# Patient Record
Sex: Male | Born: 1970 | Race: Black or African American | Hispanic: No | Marital: Single | Smoking: Current every day smoker
Health system: Southern US, Community
[De-identification: ages and names within clinical notes are randomized; demographics above are authoritative.]

## PROBLEM LIST (undated history)

## (undated) DIAGNOSIS — I10 Essential (primary) hypertension: Secondary | ICD-10-CM

## (undated) DIAGNOSIS — N182 Chronic kidney disease, stage 2 (mild): Secondary | ICD-10-CM

## (undated) DIAGNOSIS — K5732 Diverticulitis of large intestine without perforation or abscess without bleeding: Secondary | ICD-10-CM

## (undated) HISTORY — PX: ABDOMINAL SURGERY: SHX537

## (undated) HISTORY — PX: CHEST SURGERY: SHX595

---

## 2015-01-30 ENCOUNTER — Inpatient Hospital Stay (HOSPITAL_COMMUNITY)
Admission: EM | Admit: 2015-01-30 | Discharge: 2015-02-02 | DRG: 392 | Disposition: A | Discharge status: Home / Self Care | Payer: Self-pay | Attending: Internal Medicine | Admitting: Internal Medicine

## 2015-01-30 ENCOUNTER — Emergency Department (HOSPITAL_COMMUNITY): Payer: Self-pay

## 2015-01-30 ENCOUNTER — Encounter (HOSPITAL_COMMUNITY): Payer: Self-pay

## 2015-01-30 DIAGNOSIS — K5792 Diverticulitis of intestine, part unspecified, without perforation or abscess without bleeding: Secondary | ICD-10-CM | POA: Diagnosis present

## 2015-01-30 DIAGNOSIS — I129 Hypertensive chronic kidney disease with stage 1 through stage 4 chronic kidney disease, or unspecified chronic kidney disease: Secondary | ICD-10-CM | POA: Diagnosis present

## 2015-01-30 DIAGNOSIS — Z9119 Patient's noncompliance with other medical treatment and regimen: Secondary | ICD-10-CM | POA: Diagnosis present

## 2015-01-30 DIAGNOSIS — K5732 Diverticulitis of large intestine without perforation or abscess without bleeding: Principal | ICD-10-CM

## 2015-01-30 DIAGNOSIS — F17210 Nicotine dependence, cigarettes, uncomplicated: Secondary | ICD-10-CM | POA: Diagnosis present

## 2015-01-30 DIAGNOSIS — F17200 Nicotine dependence, unspecified, uncomplicated: Secondary | ICD-10-CM | POA: Diagnosis present

## 2015-01-30 DIAGNOSIS — E876 Hypokalemia: Secondary | ICD-10-CM | POA: Diagnosis present

## 2015-01-30 DIAGNOSIS — N183 Chronic kidney disease, stage 3 (moderate): Secondary | ICD-10-CM | POA: Diagnosis present

## 2015-01-30 DIAGNOSIS — I10 Essential (primary) hypertension: Secondary | ICD-10-CM | POA: Diagnosis present

## 2015-01-30 DIAGNOSIS — N182 Chronic kidney disease, stage 2 (mild): Secondary | ICD-10-CM

## 2015-01-30 DIAGNOSIS — N179 Acute kidney failure, unspecified: Secondary | ICD-10-CM | POA: Diagnosis present

## 2015-01-30 HISTORY — DX: Diverticulitis of large intestine without perforation or abscess without bleeding: K57.32

## 2015-01-30 HISTORY — DX: Essential (primary) hypertension: I10

## 2015-01-30 HISTORY — DX: Chronic kidney disease, stage 2 (mild): N18.2

## 2015-01-30 LAB — URINALYSIS, ROUTINE W REFLEX MICROSCOPIC
Bilirubin Urine: NEGATIVE
Glucose, UA: NEGATIVE mg/dL
Leukocytes, UA: NEGATIVE
NITRITE: NEGATIVE
Protein, ur: 30 mg/dL — AB
Specific Gravity, Urine: >1.03 — ABNORMAL HIGH (ref 1.005–1.030)
Urobilinogen, UA: 0.2 mg/dL (ref 0.0–1.0)
pH: 5.5 (ref 5.0–8.0)

## 2015-01-30 LAB — CBC WITH DIFFERENTIAL/PLATELET
BASOS PCT: 0 % (ref 0–1)
Basophils Absolute: 0 10*3/uL (ref 0.0–0.1)
Eosinophils Absolute: 0 10*3/uL (ref 0.0–0.7)
Eosinophils Relative: 0 % (ref 0–5)
HEMATOCRIT: 47.8 % (ref 39.0–52.0)
HEMOGLOBIN: 16.1 g/dL (ref 13.0–17.0)
Lymphocytes Relative: 15 % (ref 12–46)
Lymphs Abs: 2.3 10*3/uL (ref 0.7–4.0)
MCH: 29.3 pg (ref 26.0–34.0)
MCHC: 33.7 g/dL (ref 30.0–36.0)
MCV: 87.1 fL (ref 78.0–100.0)
MONO ABS: 1.7 10*3/uL — AB (ref 0.1–1.0)
Monocytes Relative: 12 % (ref 3–12)
NEUTROS PCT: 73 % (ref 43–77)
Neutro Abs: 11 10*3/uL — ABNORMAL HIGH (ref 1.7–7.7)
Platelets: 246 10*3/uL (ref 150–400)
RBC: 5.49 MIL/uL (ref 4.22–5.81)
RDW: 13.9 % (ref 11.5–15.5)
WBC: 15 10*3/uL — ABNORMAL HIGH (ref 4.0–10.5)

## 2015-01-30 LAB — COMPREHENSIVE METABOLIC PANEL
ALBUMIN: 4.4 g/dL (ref 3.5–5.0)
ALK PHOS: 78 U/L (ref 38–126)
ALT: 20 U/L (ref 17–63)
ANION GAP: 12 (ref 5–15)
AST: 20 U/L (ref 15–41)
BUN: 13 mg/dL (ref 6–20)
CO2: 24 mmol/L (ref 22–32)
Calcium: 9.3 mg/dL (ref 8.9–10.3)
Chloride: 102 mmol/L (ref 101–111)
Creatinine, Ser: 1.45 mg/dL — ABNORMAL HIGH (ref 0.61–1.24)
GFR calc Af Amer: >60 mL/min (ref >60)
GFR calc non Af Amer: 58 mL/min — ABNORMAL LOW (ref >60)
GLUCOSE: 102 mg/dL — AB (ref 65–99)
POTASSIUM: 3.3 mmol/L — AB (ref 3.5–5.1)
Sodium: 138 mmol/L (ref 135–145)
Total Bilirubin: 0.9 mg/dL (ref 0.3–1.2)
Total Protein: 8.3 g/dL — ABNORMAL HIGH (ref 6.5–8.1)

## 2015-01-30 LAB — URINE MICROSCOPIC-ADD ON

## 2015-01-30 LAB — LIPASE, BLOOD: Lipase: 20 U/L — ABNORMAL LOW (ref 22–51)

## 2015-01-30 MED ORDER — METRONIDAZOLE IN NACL 5-0.79 MG/ML-% IV SOLN
500.0000 mg | Freq: Once | INTRAVENOUS | Status: AC
  Administered 2015-01-31: 500 mg via INTRAVENOUS
  Filled 2015-01-30: qty 100

## 2015-01-30 MED ORDER — IOHEXOL 300 MG/ML  SOLN
50.0000 mL | Freq: Once | INTRAMUSCULAR | Status: AC | PRN
  Administered 2015-01-30: 50 mL via ORAL

## 2015-01-30 MED ORDER — SODIUM CHLORIDE 0.9 % IV BOLUS (SEPSIS)
1000.0000 mL | Freq: Once | INTRAVENOUS | Status: AC
  Administered 2015-01-30: 1000 mL via INTRAVENOUS

## 2015-01-30 MED ORDER — MORPHINE SULFATE 4 MG/ML IJ SOLN
4.0000 mg | Freq: Once | INTRAMUSCULAR | Status: AC
  Administered 2015-01-30: 4 mg via INTRAVENOUS
  Filled 2015-01-30: qty 1

## 2015-01-30 MED ORDER — HYDROMORPHONE HCL 1 MG/ML IJ SOLN
1.0000 mg | Freq: Once | INTRAMUSCULAR | Status: DC
  Filled 2015-01-30: qty 1

## 2015-01-30 MED ORDER — CIPROFLOXACIN IN D5W 400 MG/200ML IV SOLN
400.0000 mg | Freq: Once | INTRAVENOUS | Status: AC
  Administered 2015-01-30: 400 mg via INTRAVENOUS
  Filled 2015-01-30: qty 200

## 2015-01-30 MED ORDER — IOHEXOL 300 MG/ML  SOLN
100.0000 mL | Freq: Once | INTRAMUSCULAR | Status: AC | PRN
  Administered 2015-01-30: 100 mL via INTRAVENOUS

## 2015-01-30 MED ORDER — HYDROMORPHONE HCL 1 MG/ML IJ SOLN
1.0000 mg | Freq: Once | INTRAMUSCULAR | Status: AC
  Administered 2015-01-30: 1 mg via INTRAVENOUS

## 2015-01-30 NOTE — ED Notes (Signed)
Patient is resting comfortably. 
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
## 2015-01-30 NOTE — ED Notes (Signed)
I have been having abdominal pain for the past 3 days per pt. I have been having nausea, vomiting, and diarrhea (after taking Epson salt).  Initially thought he was constipated. The pain has been really severe. Hurting more on the left side per pt.

## 2015-01-31 ENCOUNTER — Encounter (HOSPITAL_COMMUNITY): Payer: Self-pay | Admitting: Internal Medicine

## 2015-01-31 DIAGNOSIS — F17200 Nicotine dependence, unspecified, uncomplicated: Secondary | ICD-10-CM | POA: Diagnosis present

## 2015-01-31 DIAGNOSIS — I10 Essential (primary) hypertension: Secondary | ICD-10-CM | POA: Diagnosis present

## 2015-01-31 DIAGNOSIS — N179 Acute kidney failure, unspecified: Secondary | ICD-10-CM | POA: Insufficient documentation

## 2015-01-31 DIAGNOSIS — K5792 Diverticulitis of intestine, part unspecified, without perforation or abscess without bleeding: Secondary | ICD-10-CM | POA: Diagnosis present

## 2015-01-31 DIAGNOSIS — E876 Hypokalemia: Secondary | ICD-10-CM | POA: Diagnosis present

## 2015-01-31 LAB — CBC
HCT: 42.7 % (ref 39.0–52.0)
Hemoglobin: 13.8 g/dL (ref 13.0–17.0)
MCH: 28.5 pg (ref 26.0–34.0)
MCHC: 32.3 g/dL (ref 30.0–36.0)
MCV: 88.2 fL (ref 78.0–100.0)
Platelets: 231 10*3/uL (ref 150–400)
RBC: 4.84 MIL/uL (ref 4.22–5.81)
RDW: 13.9 % (ref 11.5–15.5)
WBC: 13.7 10*3/uL — ABNORMAL HIGH (ref 4.0–10.5)

## 2015-01-31 LAB — BASIC METABOLIC PANEL
Anion gap: 11 (ref 5–15)
BUN: 9 mg/dL (ref 6–20)
CHLORIDE: 102 mmol/L (ref 101–111)
CO2: 22 mmol/L (ref 22–32)
Calcium: 8.4 mg/dL — ABNORMAL LOW (ref 8.9–10.3)
Creatinine, Ser: 1.43 mg/dL — ABNORMAL HIGH (ref 0.61–1.24)
GFR calc Af Amer: >60 mL/min (ref >60)
GFR calc non Af Amer: 59 mL/min — ABNORMAL LOW (ref >60)
Glucose, Bld: 118 mg/dL — ABNORMAL HIGH (ref 65–99)
Potassium: 3.1 mmol/L — ABNORMAL LOW (ref 3.5–5.1)
Sodium: 135 mmol/L (ref 135–145)

## 2015-01-31 LAB — MAGNESIUM: Magnesium: 2.2 mg/dL (ref 1.7–2.4)

## 2015-01-31 MED ORDER — POTASSIUM CHLORIDE CRYS ER 20 MEQ PO TBCR
20.0000 meq | EXTENDED_RELEASE_TABLET | Freq: Three times a day (TID) | ORAL | Status: DC
  Administered 2015-01-31: 20 meq via ORAL
  Filled 2015-01-31: qty 1

## 2015-01-31 MED ORDER — CLONIDINE HCL 0.2 MG/24HR TD PTWK
0.2000 mg | MEDICATED_PATCH | TRANSDERMAL | Status: DC
  Administered 2015-01-31: 0.2 mg via TRANSDERMAL
  Filled 2015-01-31: qty 1

## 2015-01-31 MED ORDER — HEPARIN SODIUM (PORCINE) 5000 UNIT/ML IJ SOLN
5000.0000 [IU] | Freq: Three times a day (TID) | INTRAMUSCULAR | Status: DC
  Administered 2015-01-31 – 2015-02-01 (×3): 5000 [IU] via SUBCUTANEOUS
  Filled 2015-01-31 (×3): qty 1

## 2015-01-31 MED ORDER — HYDRALAZINE HCL 25 MG PO TABS
25.0000 mg | ORAL_TABLET | Freq: Three times a day (TID) | ORAL | Status: DC
  Administered 2015-01-31: 25 mg via ORAL
  Filled 2015-01-31: qty 1

## 2015-01-31 MED ORDER — POTASSIUM CHLORIDE IN NACL 40-0.9 MEQ/L-% IV SOLN
INTRAVENOUS | Status: DC
  Administered 2015-01-31 – 2015-02-01 (×2): 75 mL/h via INTRAVENOUS

## 2015-01-31 MED ORDER — ONDANSETRON HCL 4 MG/2ML IJ SOLN
4.0000 mg | Freq: Four times a day (QID) | INTRAMUSCULAR | Status: DC | PRN
  Administered 2015-01-31 (×2): 4 mg via INTRAVENOUS
  Filled 2015-01-31 (×2): qty 2

## 2015-01-31 MED ORDER — NICOTINE 14 MG/24HR TD PT24
14.0000 mg | MEDICATED_PATCH | Freq: Every day | TRANSDERMAL | Status: DC
  Administered 2015-01-31 – 2015-02-02 (×3): 14 mg via TRANSDERMAL
  Filled 2015-01-31 (×3): qty 1

## 2015-01-31 MED ORDER — METRONIDAZOLE IN NACL 5-0.79 MG/ML-% IV SOLN
500.0000 mg | Freq: Three times a day (TID) | INTRAVENOUS | Status: DC
  Administered 2015-01-31 – 2015-02-02 (×6): 500 mg via INTRAVENOUS
  Filled 2015-01-31 (×6): qty 100

## 2015-01-31 MED ORDER — CLONIDINE HCL 0.1 MG PO TABS
0.1000 mg | ORAL_TABLET | Freq: Three times a day (TID) | ORAL | Status: DC | PRN
  Administered 2015-01-31: 0.1 mg via ORAL
  Filled 2015-01-31: qty 1

## 2015-01-31 MED ORDER — CIPROFLOXACIN IN D5W 400 MG/200ML IV SOLN
400.0000 mg | Freq: Two times a day (BID) | INTRAVENOUS | Status: DC
  Administered 2015-01-31 – 2015-02-01 (×4): 400 mg via INTRAVENOUS
  Filled 2015-01-31 (×4): qty 200

## 2015-01-31 MED ORDER — POTASSIUM CHLORIDE 10 MEQ/100ML IV SOLN
10.0000 meq | INTRAVENOUS | Status: AC
  Administered 2015-01-31 (×4): 10 meq via INTRAVENOUS
  Filled 2015-01-31 (×3): qty 100

## 2015-01-31 MED ORDER — HYDRALAZINE HCL 20 MG/ML IJ SOLN
5.0000 mg | INTRAMUSCULAR | Status: DC | PRN
  Administered 2015-02-01: 5 mg via INTRAVENOUS
  Filled 2015-01-31: qty 1

## 2015-01-31 MED ORDER — LISINOPRIL 10 MG PO TABS: 20.0000 mg | ORAL_TABLET | Freq: Every day | ORAL | Status: DC

## 2015-01-31 MED ORDER — ONDANSETRON HCL 4 MG PO TABS: 4.0000 mg | ORAL_TABLET | Freq: Four times a day (QID) | ORAL | Status: DC | PRN

## 2015-01-31 MED ORDER — HYDROMORPHONE HCL 1 MG/ML IJ SOLN
1.0000 mg | INTRAMUSCULAR | Status: DC | PRN
  Administered 2015-01-31 – 2015-02-01 (×7): 1 mg via INTRAVENOUS
  Filled 2015-01-31 (×7): qty 1

## 2015-01-31 MED ORDER — DEXTROSE-NACL 5-0.9 % IV SOLN
INTRAVENOUS | Status: DC
  Administered 2015-01-31: 02:00:00 via INTRAVENOUS
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
## 2015-02-01 DIAGNOSIS — I10 Essential (primary) hypertension: Secondary | ICD-10-CM

## 2015-02-01 DIAGNOSIS — E876 Hypokalemia: Secondary | ICD-10-CM

## 2015-02-01 DIAGNOSIS — N179 Acute kidney failure, unspecified: Secondary | ICD-10-CM

## 2015-02-01 DIAGNOSIS — K5792 Diverticulitis of intestine, part unspecified, without perforation or abscess without bleeding: Secondary | ICD-10-CM

## 2015-02-01 LAB — BASIC METABOLIC PANEL
ANION GAP: 8 (ref 5–15)
BUN: 8 mg/dL (ref 6–20)
CO2: 28 mmol/L (ref 22–32)
Calcium: 9 mg/dL (ref 8.9–10.3)
Chloride: 102 mmol/L (ref 101–111)
Creatinine, Ser: 1.49 mg/dL — ABNORMAL HIGH (ref 0.61–1.24)
GFR calc Af Amer: >60 mL/min (ref >60)
GFR calc non Af Amer: 56 mL/min — ABNORMAL LOW (ref >60)
GLUCOSE: 89 mg/dL (ref 65–99)
POTASSIUM: 3.8 mmol/L (ref 3.5–5.1)
SODIUM: 138 mmol/L (ref 135–145)

## 2015-02-01 LAB — CBC
HCT: 42.8 % (ref 39.0–52.0)
Hemoglobin: 13.9 g/dL (ref 13.0–17.0)
MCH: 28.4 pg (ref 26.0–34.0)
MCHC: 32.5 g/dL (ref 30.0–36.0)
MCV: 87.5 fL (ref 78.0–100.0)
PLATELETS: 230 10*3/uL (ref 150–400)
RBC: 4.89 MIL/uL (ref 4.22–5.81)
RDW: 13.7 % (ref 11.5–15.5)
WBC: 6.8 10*3/uL (ref 4.0–10.5)

## 2015-02-01 MED ORDER — PROMETHAZINE HCL 25 MG/ML IJ SOLN: 12.5000 mg | Freq: Four times a day (QID) | INTRAMUSCULAR | Status: DC | PRN

## 2015-02-01 MED ORDER — ONDANSETRON HCL 4 MG/2ML IJ SOLN
4.0000 mg | Freq: Four times a day (QID) | INTRAMUSCULAR | Status: DC
  Administered 2015-02-01 (×3): 4 mg via INTRAVENOUS
  Filled 2015-02-01 (×3): qty 2

## 2015-02-01 MED ORDER — POTASSIUM CHLORIDE 10 MEQ/100ML IV SOLN
10.0000 meq | INTRAVENOUS | Status: AC
  Administered 2015-02-01 (×4): 10 meq via INTRAVENOUS
  Filled 2015-02-01: qty 100

## 2015-02-01 NOTE — Progress Notes (Signed)
Utilization review Completed Adilson Grafton RN BSN   

## 2015-02-01 NOTE — Progress Notes (Signed)
This Clinical research associatewriter entered into patients room and observed him eating chips, crackers and drinking juice, patient was informed he was on a full liquid diet, patient states" I'm hungry and I've been eating regular food since earlier today",no c/o nausea or vomiting noted.
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
## 2015-02-01 NOTE — Progress Notes (Signed)
Notified MD of patient wanting a diet.  Patient has not had any episodes of vomiting since yesterday am.  Patient tolerated ice chips.  Patient is eager to advance to a clear liquid diet to see how he tolerates it.

## 2015-02-02 ENCOUNTER — Encounter (HOSPITAL_COMMUNITY): Payer: Self-pay | Admitting: Internal Medicine

## 2015-02-02 DIAGNOSIS — K5732 Diverticulitis of large intestine without perforation or abscess without bleeding: Principal | ICD-10-CM

## 2015-02-02 DIAGNOSIS — N182 Chronic kidney disease, stage 2 (mild): Secondary | ICD-10-CM | POA: Diagnosis present

## 2015-02-02 DIAGNOSIS — Z720 Tobacco use: Secondary | ICD-10-CM

## 2015-02-02 MED ORDER — METRONIDAZOLE 500 MG PO TABS
500.0000 mg | ORAL_TABLET | Freq: Three times a day (TID) | ORAL | Status: DC
  Administered 2015-02-02: 500 mg via ORAL
  Filled 2015-02-02: qty 1

## 2015-02-02 MED ORDER — HYDRALAZINE HCL 25 MG PO TABS: 25.0000 mg | ORAL_TABLET | Freq: Two times a day (BID) | ORAL | Status: DC

## 2015-02-02 MED ORDER — CIPROFLOXACIN HCL 500 MG PO TABS: 500.0000 mg | ORAL_TABLET | Freq: Two times a day (BID) | ORAL | Status: DC

## 2015-02-02 MED ORDER — ACETAMINOPHEN 500 MG PO TABS: 500.0000 mg | ORAL_TABLET | Freq: Four times a day (QID) | ORAL | Status: DC | PRN

## 2015-02-02 MED ORDER — CLONIDINE HCL 0.1 MG PO TABS: 0.1000 mg | ORAL_TABLET | Freq: Two times a day (BID) | ORAL | Status: DC

## 2015-02-02 MED ORDER — METRONIDAZOLE 500 MG PO TABS: 500.0000 mg | ORAL_TABLET | Freq: Three times a day (TID) | ORAL | Status: DC

## 2015-02-02 MED ORDER — CIPROFLOXACIN HCL 250 MG PO TABS
500.0000 mg | ORAL_TABLET | Freq: Two times a day (BID) | ORAL | Status: DC
  Administered 2015-02-02: 500 mg via ORAL
  Filled 2015-02-02: qty 2

## 2015-02-02 MED ORDER — CLONIDINE HCL 0.1 MG PO TABS
0.1000 mg | ORAL_TABLET | Freq: Two times a day (BID) | ORAL | Status: DC
  Administered 2015-02-02: 0.1 mg via ORAL
  Filled 2015-02-02: qty 1

## 2015-02-02 NOTE — Progress Notes (Signed)
Discharge instructions reviewed with patient and girlfriend, verbalized understanding. Medications reviewed. Escorted to lobby by nursing staff ambulatory in stable condition.

## 2015-02-02 NOTE — Care Management Note (Signed)
Case Management Note  Patient Details  Name: Luke Marshall MRN: [id number] Date of Birth: 09-Aug-1971  Expected Discharge Date:                  Expected Discharge Plan:  Home/Self Care  In-House Referral:  Financial Counselor  Discharge planning Services  CM Consult  Post Acute Care Choice:  NA Choice offered to:  NA  DME Arranged:    DME Agency:     HH Arranged:    HH Agency:     Status of Service:  Completed, signed off  Medicare Important Message Given:    Date Medicare IM Given:    Medicare IM give by:    Date Additional Medicare IM Given:    Additional Medicare Important Message give by:     If discussed at Long Length of Stay Meetings, dates discussed:    Additional Comments: Pt is from home and independent at baseline. Pt is uninsured and has no PCP. [facility]. Pt will call in the morning to make follow up appointment there. Pt referred to financial counselor for financial needs. Most of pt's medications on $4 list at [facility]. Pt given MATCH voucher for those meds not on $4 list. No further CM needs at the time of DC.   Malcolm Metrohildress, Yomar Mejorado Demske, RN 02/02/2015, 2:27 PM
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
## 2016-07-10 ENCOUNTER — Emergency Department (HOSPITAL_COMMUNITY)
Admission: EM | Admit: 2016-07-10 | Discharge: 2016-07-10 | Disposition: A | Discharge status: Home / Self Care | Payer: Self-pay | Attending: Emergency Medicine | Admitting: Emergency Medicine

## 2016-07-10 ENCOUNTER — Emergency Department (HOSPITAL_COMMUNITY): Payer: Self-pay

## 2016-07-10 ENCOUNTER — Encounter (HOSPITAL_COMMUNITY): Payer: Self-pay | Admitting: Emergency Medicine

## 2016-07-10 DIAGNOSIS — Z79899 Other long term (current) drug therapy: Secondary | ICD-10-CM | POA: Insufficient documentation

## 2016-07-10 DIAGNOSIS — N182 Chronic kidney disease, stage 2 (mild): Secondary | ICD-10-CM | POA: Insufficient documentation

## 2016-07-10 DIAGNOSIS — L03114 Cellulitis of left upper limb: Secondary | ICD-10-CM | POA: Insufficient documentation

## 2016-07-10 DIAGNOSIS — F17210 Nicotine dependence, cigarettes, uncomplicated: Secondary | ICD-10-CM | POA: Insufficient documentation

## 2016-07-10 DIAGNOSIS — M79622 Pain in left upper arm: Secondary | ICD-10-CM

## 2016-07-10 DIAGNOSIS — R52 Pain, unspecified: Secondary | ICD-10-CM

## 2016-07-10 DIAGNOSIS — I129 Hypertensive chronic kidney disease with stage 1 through stage 4 chronic kidney disease, or unspecified chronic kidney disease: Secondary | ICD-10-CM | POA: Insufficient documentation

## 2016-07-10 LAB — LACTIC ACID, PLASMA: LACTIC ACID, VENOUS: 1 mmol/L (ref 0.5–1.9)

## 2016-07-10 LAB — CBC WITH DIFFERENTIAL/PLATELET
BASOS ABS: 0 10*3/uL (ref 0.0–0.1)
BASOS PCT: 0 %
EOS ABS: 0.1 10*3/uL (ref 0.0–0.7)
Eosinophils Relative: 2 %
HEMATOCRIT: 43.6 % (ref 39.0–52.0)
HEMOGLOBIN: 14.9 g/dL (ref 13.0–17.0)
Lymphocytes Relative: 25 %
Lymphs Abs: 2 10*3/uL (ref 0.7–4.0)
MCH: 30.2 pg (ref 26.0–34.0)
MCHC: 34.2 g/dL (ref 30.0–36.0)
MCV: 88.4 fL (ref 78.0–100.0)
Monocytes Absolute: 0.6 10*3/uL (ref 0.1–1.0)
Monocytes Relative: 8 %
NEUTROS ABS: 5.2 10*3/uL (ref 1.7–7.7)
NEUTROS PCT: 65 %
Platelets: 207 10*3/uL (ref 150–400)
RBC: 4.93 MIL/uL (ref 4.22–5.81)
RDW: 13.3 % (ref 11.5–15.5)
WBC: 7.9 10*3/uL (ref 4.0–10.5)

## 2016-07-10 LAB — BASIC METABOLIC PANEL
ANION GAP: 5 (ref 5–15)
BUN: 16 mg/dL (ref 6–20)
CALCIUM: 9.2 mg/dL (ref 8.9–10.3)
CHLORIDE: 106 mmol/L (ref 101–111)
CO2: 27 mmol/L (ref 22–32)
Creatinine, Ser: 1.34 mg/dL — ABNORMAL HIGH (ref 0.61–1.24)
GFR calc non Af Amer: >60 mL/min (ref >60)
Glucose, Bld: 107 mg/dL — ABNORMAL HIGH (ref 65–99)
Potassium: 3.5 mmol/L (ref 3.5–5.1)
SODIUM: 138 mmol/L (ref 135–145)

## 2016-07-10 LAB — PROTIME-INR
INR: 0.95
PROTHROMBIN TIME: 12.7 s (ref 11.4–15.2)

## 2016-07-10 MED ORDER — OXYCODONE-ACETAMINOPHEN 5-325 MG PO TABS: 1.0000 | ORAL_TABLET | Freq: Four times a day (QID) | ORAL | 0 refills | Status: AC | PRN

## 2016-07-10 MED ORDER — CEPHALEXIN 500 MG PO CAPS: 500.0000 mg | ORAL_CAPSULE | Freq: Four times a day (QID) | ORAL | 0 refills | Status: AC

## 2016-07-10 MED ORDER — MORPHINE SULFATE (PF) 2 MG/ML IV SOLN
4.0000 mg | Freq: Once | INTRAVENOUS | Status: AC
Start: 2016-07-10 — End: 2016-07-10
  Administered 2016-07-10: 4 mg via INTRAVENOUS
  Filled 2016-07-10: qty 2

## 2016-07-10 MED ORDER — SODIUM CHLORIDE 0.9 % IV BOLUS (SEPSIS)
1000.0000 mL | Freq: Once | INTRAVENOUS | Status: AC
  Administered 2016-07-10: 1000 mL via INTRAVENOUS

## 2016-07-10 MED ORDER — CEPHALEXIN 500 MG PO CAPS
500.0000 mg | ORAL_CAPSULE | Freq: Once | ORAL | Status: AC
  Administered 2016-07-10: 500 mg via ORAL
  Filled 2016-07-10: qty 1

## 2016-07-10 MED ORDER — IOPAMIDOL (ISOVUE-300) INJECTION 61%
100.0000 mL | Freq: Once | INTRAVENOUS | Status: AC | PRN
  Administered 2016-07-10: 100 mL via INTRAVENOUS
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
## 2016-07-10 NOTE — Discharge Instructions (Signed)
Please return tomorrow for ultrasound to look for blood clot. Please take your antibiotics for the skin infection. If symptoms worsen, please return to the nearest emergency department. Please follow-up with a PCP.

## 2016-07-11 ENCOUNTER — Other Ambulatory Visit (INDEPENDENT_AMBULATORY_CARE_PROVIDER_SITE_OTHER): Payer: Self-pay | Admitting: Otolaryngology

## 2016-07-11 ENCOUNTER — Ambulatory Visit (HOSPITAL_COMMUNITY)
Admission: RE | Admit: 2016-07-11 | Discharge: 2016-07-11 | Disposition: A | Discharge status: Home / Self Care | Payer: Self-pay | Source: Ambulatory Visit | Attending: Emergency Medicine | Admitting: Emergency Medicine

## 2016-07-11 DIAGNOSIS — R52 Pain, unspecified: Secondary | ICD-10-CM | POA: Insufficient documentation
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
## 2018-06-15 ENCOUNTER — Emergency Department (HOSPITAL_COMMUNITY): Payer: Self-pay

## 2018-06-15 ENCOUNTER — Emergency Department (HOSPITAL_COMMUNITY)
Admission: EM | Admit: 2018-06-15 | Discharge: 2018-06-15 | Disposition: A | Discharge status: Home / Self Care | Payer: Self-pay | Attending: Emergency Medicine | Admitting: Emergency Medicine

## 2018-06-15 ENCOUNTER — Other Ambulatory Visit: Payer: Self-pay

## 2018-06-15 ENCOUNTER — Encounter (HOSPITAL_COMMUNITY): Payer: Self-pay | Admitting: Radiology

## 2018-06-15 DIAGNOSIS — I129 Hypertensive chronic kidney disease with stage 1 through stage 4 chronic kidney disease, or unspecified chronic kidney disease: Secondary | ICD-10-CM | POA: Insufficient documentation

## 2018-06-15 DIAGNOSIS — N182 Chronic kidney disease, stage 2 (mild): Secondary | ICD-10-CM | POA: Insufficient documentation

## 2018-06-15 DIAGNOSIS — Z23 Encounter for immunization: Secondary | ICD-10-CM | POA: Insufficient documentation

## 2018-06-15 DIAGNOSIS — I10 Essential (primary) hypertension: Secondary | ICD-10-CM

## 2018-06-15 DIAGNOSIS — L02511 Cutaneous abscess of right hand: Secondary | ICD-10-CM | POA: Insufficient documentation

## 2018-06-15 DIAGNOSIS — F17210 Nicotine dependence, cigarettes, uncomplicated: Secondary | ICD-10-CM | POA: Insufficient documentation

## 2018-06-15 MED ORDER — CEPHALEXIN 500 MG PO CAPS
500.0000 mg | ORAL_CAPSULE | Freq: Once | ORAL | Status: AC
  Administered 2018-06-15: 500 mg via ORAL
  Filled 2018-06-15: qty 1

## 2018-06-15 MED ORDER — ONDANSETRON HCL 4 MG PO TABS
4.0000 mg | ORAL_TABLET | Freq: Once | ORAL | Status: AC
  Administered 2018-06-15: 4 mg via ORAL
  Filled 2018-06-15: qty 1

## 2018-06-15 MED ORDER — HYDROCHLOROTHIAZIDE 12.5 MG PO TABS: 12.5000 mg | ORAL_TABLET | Freq: Every day | ORAL | 1 refills | Status: DC

## 2018-06-15 MED ORDER — CEPHALEXIN 500 MG PO CAPS: 500.0000 mg | ORAL_CAPSULE | Freq: Four times a day (QID) | ORAL | 0 refills | Status: AC

## 2018-06-15 MED ORDER — SULFAMETHOXAZOLE-TRIMETHOPRIM 800-160 MG PO TABS: 1.0000 | ORAL_TABLET | Freq: Two times a day (BID) | ORAL | 0 refills | Status: AC

## 2018-06-15 MED ORDER — SULFAMETHOXAZOLE-TRIMETHOPRIM 800-160 MG PO TABS
1.0000 | ORAL_TABLET | Freq: Once | ORAL | Status: AC
  Administered 2018-06-15: 1 via ORAL
  Filled 2018-06-15: qty 1

## 2018-06-15 MED ORDER — TETANUS-DIPHTH-ACELL PERTUSSIS 5-2.5-18.5 LF-MCG/0.5 IM SUSP
0.5000 mL | Freq: Once | INTRAMUSCULAR | Status: AC
  Administered 2018-06-15: 0.5 mL via INTRAMUSCULAR
  Filled 2018-06-15: qty 0.5

## 2018-06-15 MED ORDER — AMLODIPINE BESYLATE 5 MG PO TABS
10.0000 mg | ORAL_TABLET | Freq: Once | ORAL | Status: AC
  Administered 2018-06-15: 10 mg via ORAL
  Filled 2018-06-15: qty 2

## 2018-06-15 NOTE — ED Notes (Signed)
Pt returned from X Ray.

## 2018-06-15 NOTE — Discharge Instructions (Signed)
Your examination suggest an abscess involving your hand.  Please soak in warm Epson salt water for 10 to 15 minutes daily.  Please use Keflex with breakfast, lunch, dinner, and at bedtime.  Please use Bactrim 2 times daily with a meal.  Please return to the emergency department if any red streaks going up your hand, continued swelling of your hand, high fever, or signs of advancing infection.  Your blood pressure was elevated today.  Evaluation of previous emergency department visits also show elevations in your blood pressure.  You were given resources for clinics and primary physicians.  Please have your blood pressure rechecked soon, and please see the 1 of the medical professionals concerning your blood pressure.  Please use hydrochlorothiazide daily with a meal.  You may need to increase bananas, orange juice, or foods high in potassium while taking the hydrochlorothiazide.

## 2018-06-15 NOTE — ED Notes (Signed)
ED Provider at bedside. 

## 2018-06-15 NOTE — ED Notes (Signed)
Patient transported to X-ray 

## 2018-06-15 NOTE — ED Triage Notes (Signed)
Awakened this am with dorsum of R hand swollen Denies injury or pain
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
## 2019-08-04 ENCOUNTER — Encounter (HOSPITAL_COMMUNITY): Payer: Self-pay

## 2019-08-04 ENCOUNTER — Emergency Department (HOSPITAL_COMMUNITY)
Admission: EM | Admit: 2019-08-04 | Discharge: 2019-08-04 | Disposition: A | Discharge status: Home / Self Care | Payer: Self-pay | Attending: Emergency Medicine | Admitting: Emergency Medicine

## 2019-08-04 ENCOUNTER — Other Ambulatory Visit: Payer: Self-pay

## 2019-08-04 ENCOUNTER — Emergency Department (HOSPITAL_COMMUNITY): Payer: Self-pay

## 2019-08-04 DIAGNOSIS — N182 Chronic kidney disease, stage 2 (mild): Secondary | ICD-10-CM | POA: Insufficient documentation

## 2019-08-04 DIAGNOSIS — I129 Hypertensive chronic kidney disease with stage 1 through stage 4 chronic kidney disease, or unspecified chronic kidney disease: Secondary | ICD-10-CM | POA: Insufficient documentation

## 2019-08-04 DIAGNOSIS — F17210 Nicotine dependence, cigarettes, uncomplicated: Secondary | ICD-10-CM | POA: Insufficient documentation

## 2019-08-04 DIAGNOSIS — M654 Radial styloid tenosynovitis [de Quervain]: Secondary | ICD-10-CM | POA: Insufficient documentation

## 2019-08-04 DIAGNOSIS — R030 Elevated blood-pressure reading, without diagnosis of hypertension: Secondary | ICD-10-CM

## 2019-08-04 MED ORDER — PREDNISONE 50 MG PO TABS
60.0000 mg | ORAL_TABLET | Freq: Once | ORAL | Status: AC
  Administered 2019-08-04: 60 mg via ORAL
  Filled 2019-08-04: qty 1

## 2019-08-04 MED ORDER — PREDNISONE 10 MG PO TABS: ORAL_TABLET | ORAL | 0 refills | Status: AC

## 2019-08-04 MED ORDER — HYDROCHLOROTHIAZIDE 25 MG PO TABS: 25.0000 mg | ORAL_TABLET | Freq: Every day | ORAL | 0 refills | Status: AC

## 2019-08-04 NOTE — Discharge Instructions (Addendum)
Take your next dose of the prednisone tomorrow evening.  Wear your splint as much as possible to rest your wrist and thumb tendons.  Rest is the primary treatment for this condition.  You may also use ice and elevation for swelling relief.  Your blood pressure is elevated and you are being prescribed a blood pressure medication.  This is a fluid pill and will cause increased urination initially.  Get your blood pressure rechecked within the next week.    [facility]  [address] [place], [place] [address] [phone]  Services [facility] offers a variety of basic health services.  Services include but are not limited to: Blood pressure checks  Heart rate checks  Blood sugar checks  Urine analysis  Rapid strep tests  Pregnancy tests.  Health education and referrals  People needing more complex services will be directed to a physician online. Using these virtual visits, doctors can evaluate and prescribe medicine and treatments. There will be no medication on-site, though [facility] will help patients fill their prescriptions at little to no cost.   For More information please go to: [email or web address]

## 2019-08-04 NOTE — ED Triage Notes (Signed)
Pt presents to ED with complaints of left wrist pain x couple days. Pt denies injury.
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
## 2020-04-13 ENCOUNTER — Encounter (HOSPITAL_COMMUNITY): Payer: Self-pay

## 2020-04-13 ENCOUNTER — Other Ambulatory Visit: Payer: Self-pay

## 2020-04-13 ENCOUNTER — Emergency Department (HOSPITAL_COMMUNITY)
Admission: EM | Admit: 2020-04-13 | Discharge: 2020-04-14 | Disposition: A | Discharge status: Home / Self Care | Payer: Self-pay | Attending: Emergency Medicine | Admitting: Emergency Medicine

## 2020-04-13 ENCOUNTER — Emergency Department (HOSPITAL_COMMUNITY): Payer: Self-pay

## 2020-04-13 DIAGNOSIS — F17210 Nicotine dependence, cigarettes, uncomplicated: Secondary | ICD-10-CM | POA: Insufficient documentation

## 2020-04-13 DIAGNOSIS — I129 Hypertensive chronic kidney disease with stage 1 through stage 4 chronic kidney disease, or unspecified chronic kidney disease: Secondary | ICD-10-CM | POA: Insufficient documentation

## 2020-04-13 DIAGNOSIS — Z79899 Other long term (current) drug therapy: Secondary | ICD-10-CM | POA: Insufficient documentation

## 2020-04-13 DIAGNOSIS — N182 Chronic kidney disease, stage 2 (mild): Secondary | ICD-10-CM | POA: Insufficient documentation

## 2020-04-13 DIAGNOSIS — M79602 Pain in left arm: Secondary | ICD-10-CM | POA: Insufficient documentation

## 2020-04-13 DIAGNOSIS — Z1810 Retained metal fragments, unspecified: Secondary | ICD-10-CM | POA: Insufficient documentation

## 2020-04-13 DIAGNOSIS — M795 Residual foreign body in soft tissue: Secondary | ICD-10-CM
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
## 2020-09-04 IMAGING — DX DG WRIST COMPLETE 3+V*L*
4 series · 4 of 4 positions shown · non-contrast
Comparison: None.

CLINICAL DATA: 47-year-old male with left wrist pain.

EXAM:
LEFT WRIST - COMPLETE 3+ VIEW

[wrist pa]
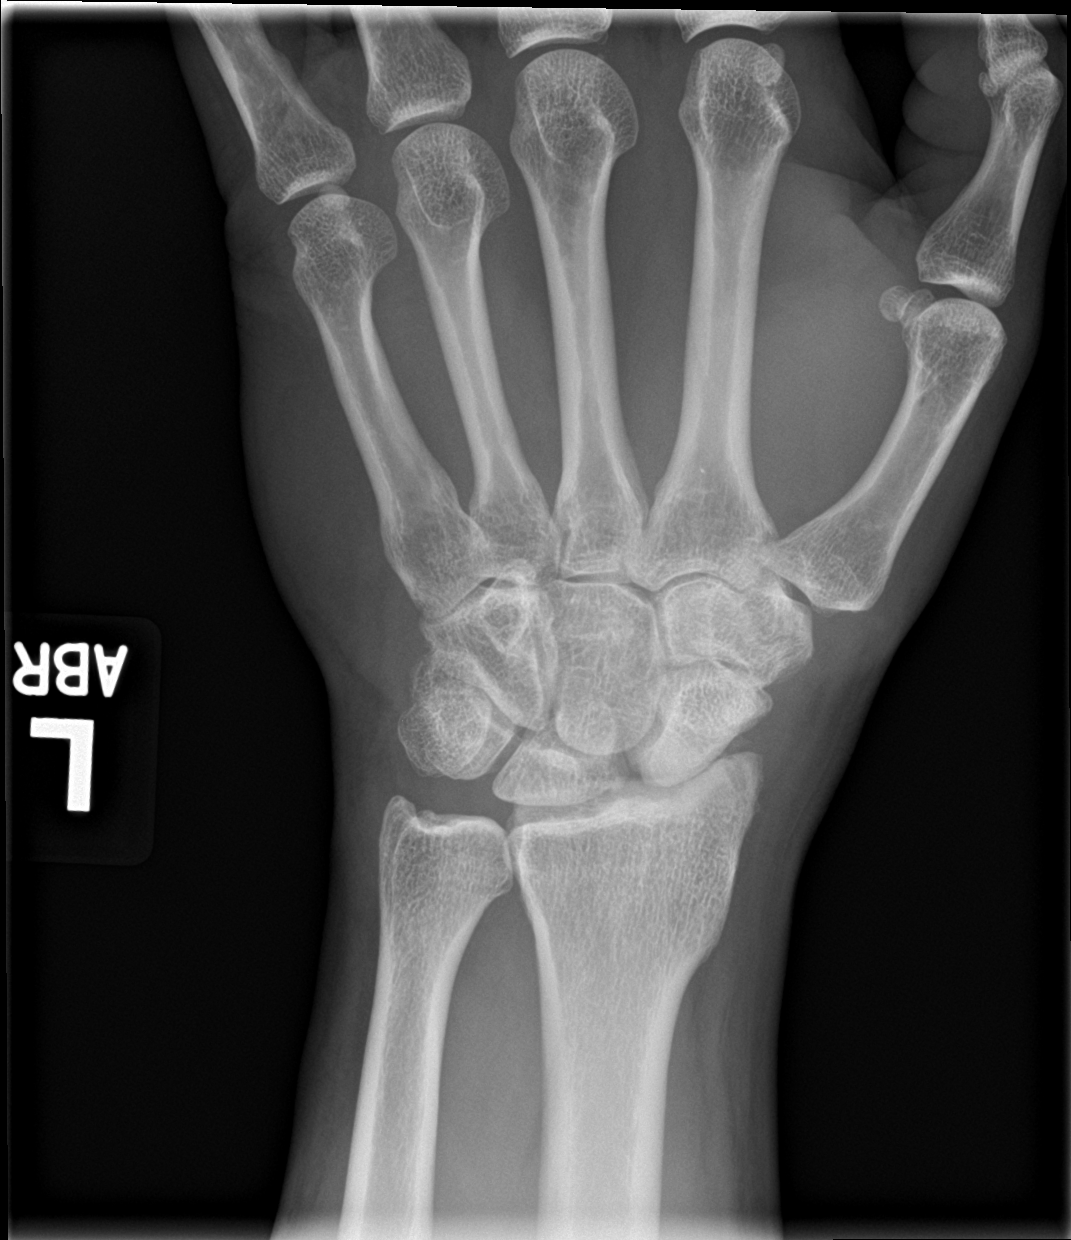

[wrist obl]
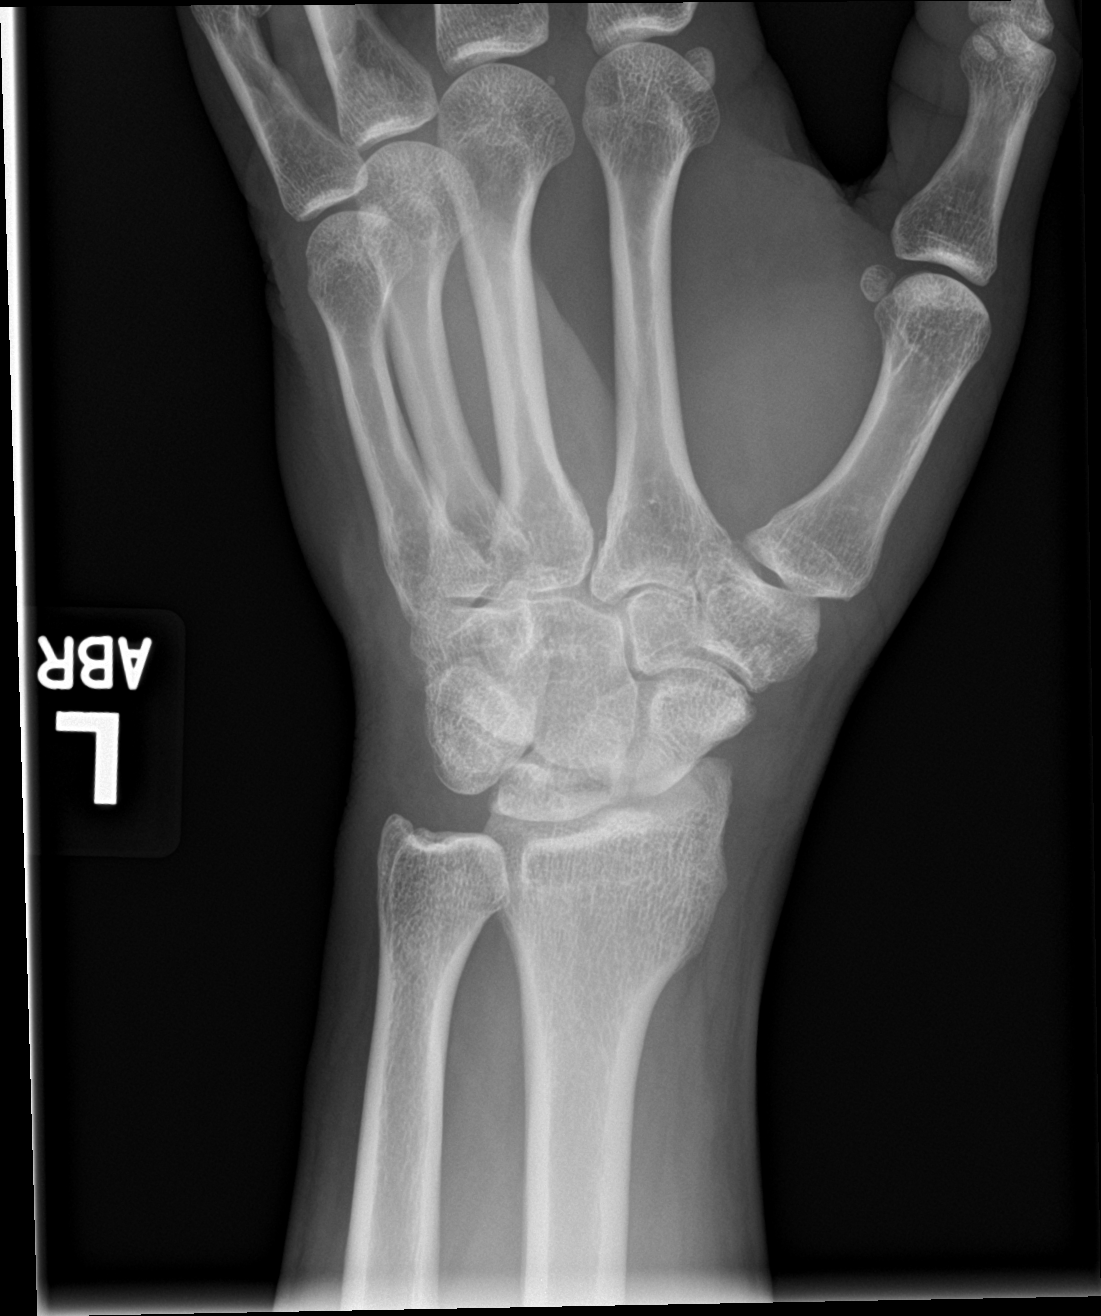

[wrist lat]
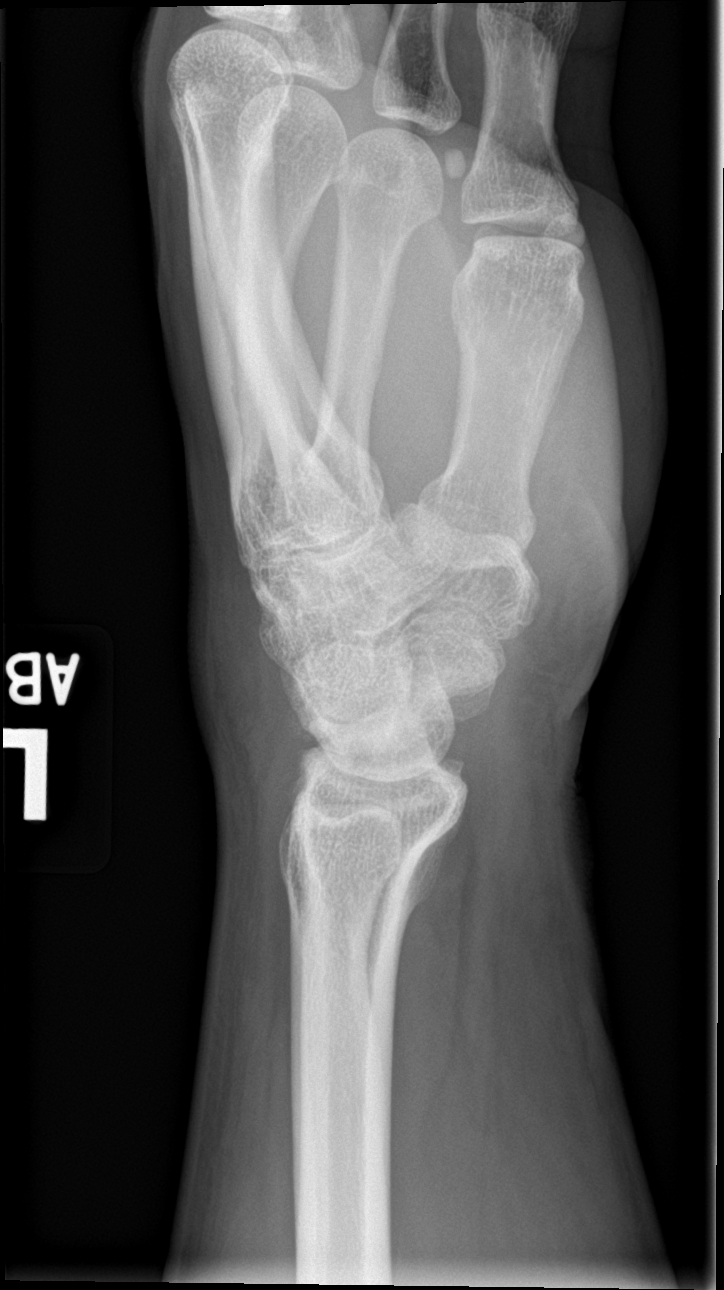

[wrist navicular]
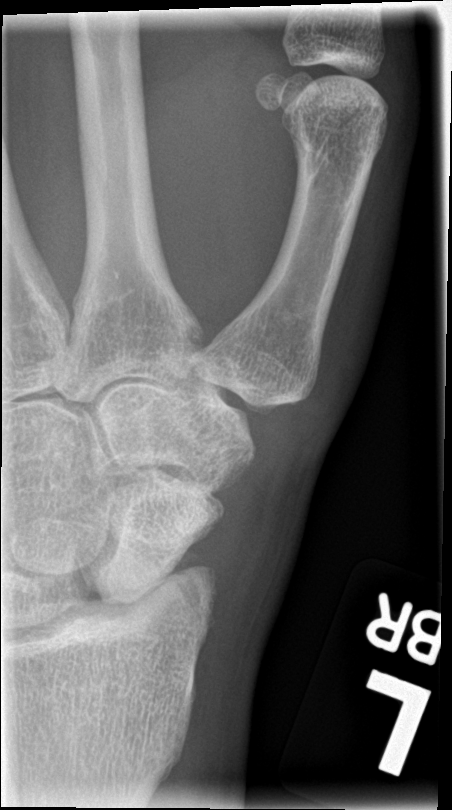

[4 of 4 positions shown; findings below may reference images not displayed]

FINDINGS: There is no acute fracture or dislocation. There is slight sclerotic
changes of the proximal half of the scaphoid. There is degenerative
changes and narrowing of the radiocarpal joint space with sclerotic
changes of the distal radial articular surface. There is soft tissue
swelling of the wrist. No radiopaque foreign object or soft tissue
gas.
IMPRESSION: 1. No acute fracture or dislocation.
2. Osteoarthritic changes of the radiocarpal joint.
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
## 2023-07-25 ENCOUNTER — Other Ambulatory Visit: Payer: Self-pay

## 2023-07-27 ENCOUNTER — Other Ambulatory Visit (HOSPITAL_COMMUNITY): Payer: Self-pay | Admitting: Family Medicine

## 2023-07-27 DIAGNOSIS — Z720 Tobacco use: Secondary | ICD-10-CM

## 2023-08-14 ENCOUNTER — Ambulatory Visit (HOSPITAL_COMMUNITY): Payer: 59

## 2023-08-14 ENCOUNTER — Encounter (HOSPITAL_COMMUNITY): Payer: Self-pay

## 2023-09-05 ENCOUNTER — Ambulatory Visit
Admission: RE | Admit: 2023-09-05 | Discharge: 2023-09-05 | Disposition: A | Discharge status: Home / Self Care | Payer: 59 | Source: Ambulatory Visit | Attending: Family Medicine | Admitting: Family Medicine

## 2023-09-05 DIAGNOSIS — Z720 Tobacco use: Secondary | ICD-10-CM
